# Patient Record
Sex: Female | Born: 1975 | Race: White | Hispanic: No | Marital: Married | Smoking: Never smoker
Health system: Southern US, Community
[De-identification: ages and names within clinical notes are randomized; demographics above are authoritative.]

## PROBLEM LIST (undated history)

## (undated) DIAGNOSIS — K219 Gastro-esophageal reflux disease without esophagitis: Secondary | ICD-10-CM

## (undated) DIAGNOSIS — IMO0001 Reserved for inherently not codable concepts without codable children: Secondary | ICD-10-CM

## (undated) HISTORY — PX: EYE SURGERY: SHX253

---

## 1998-10-11 ENCOUNTER — Inpatient Hospital Stay (HOSPITAL_COMMUNITY): Admission: AD | Admit: 1998-10-11 | Discharge: 1998-10-11 | Payer: Self-pay | Admitting: Obstetrics & Gynecology

## 1998-10-15 ENCOUNTER — Other Ambulatory Visit: Admission: RE | Admit: 1998-10-15 | Discharge: 1998-10-15 | Payer: Self-pay | Admitting: Obstetrics

## 1998-10-21 ENCOUNTER — Ambulatory Visit (HOSPITAL_COMMUNITY): Admission: RE | Admit: 1998-10-21 | Discharge: 1998-10-21 | Payer: Self-pay | Admitting: Obstetrics

## 1998-10-30 ENCOUNTER — Inpatient Hospital Stay (HOSPITAL_COMMUNITY): Admission: AD | Admit: 1998-10-30 | Discharge: 1998-10-30 | Payer: Self-pay | Admitting: Obstetrics

## 1999-01-22 ENCOUNTER — Inpatient Hospital Stay (HOSPITAL_COMMUNITY): Admission: AD | Admit: 1999-01-22 | Discharge: 1999-01-22 | Payer: Self-pay | Admitting: Obstetrics

## 1999-03-14 ENCOUNTER — Inpatient Hospital Stay (HOSPITAL_COMMUNITY): Admission: AD | Admit: 1999-03-14 | Discharge: 1999-03-14 | Payer: Self-pay | Admitting: Obstetrics

## 1999-03-18 ENCOUNTER — Other Ambulatory Visit: Admission: RE | Admit: 1999-03-18 | Discharge: 1999-03-18 | Payer: Self-pay | Admitting: Obstetrics

## 1999-04-13 ENCOUNTER — Inpatient Hospital Stay (HOSPITAL_COMMUNITY): Admission: AD | Admit: 1999-04-13 | Discharge: 1999-04-13 | Payer: Self-pay | Admitting: Obstetrics

## 1999-04-25 ENCOUNTER — Inpatient Hospital Stay (HOSPITAL_COMMUNITY): Admission: AD | Admit: 1999-04-25 | Discharge: 1999-04-25 | Payer: Self-pay | Admitting: *Deleted

## 1999-04-30 ENCOUNTER — Inpatient Hospital Stay (HOSPITAL_COMMUNITY): Admission: AD | Admit: 1999-04-30 | Discharge: 1999-04-30 | Payer: Self-pay | Admitting: Obstetrics

## 1999-05-08 ENCOUNTER — Inpatient Hospital Stay (HOSPITAL_COMMUNITY): Admission: AD | Admit: 1999-05-08 | Discharge: 1999-05-11 | Payer: Self-pay | Admitting: Obstetrics

## 2000-10-29 ENCOUNTER — Encounter: Admission: RE | Admit: 2000-10-29 | Discharge: 2000-10-29 | Payer: Self-pay | Admitting: Family Medicine

## 2000-10-29 ENCOUNTER — Encounter: Payer: Self-pay | Admitting: Family Medicine

## 2001-09-05 ENCOUNTER — Inpatient Hospital Stay (HOSPITAL_COMMUNITY): Admission: AD | Admit: 2001-09-05 | Discharge: 2001-09-05 | Payer: Self-pay | Admitting: Radiology

## 2001-09-05 ENCOUNTER — Encounter: Payer: Self-pay | Admitting: Obstetrics

## 2001-09-19 ENCOUNTER — Emergency Department (HOSPITAL_COMMUNITY): Admission: EM | Admit: 2001-09-19 | Discharge: 2001-09-19 | Payer: Self-pay | Admitting: Emergency Medicine

## 2001-10-17 ENCOUNTER — Other Ambulatory Visit: Admission: RE | Admit: 2001-10-17 | Discharge: 2001-10-17 | Payer: Self-pay

## 2001-11-25 ENCOUNTER — Ambulatory Visit (HOSPITAL_COMMUNITY): Admission: RE | Admit: 2001-11-25 | Discharge: 2001-11-25 | Payer: Self-pay | Admitting: Obstetrics and Gynecology

## 2001-11-25 ENCOUNTER — Encounter: Payer: Self-pay | Admitting: Obstetrics and Gynecology

## 2001-12-05 ENCOUNTER — Inpatient Hospital Stay (HOSPITAL_COMMUNITY): Admission: AD | Admit: 2001-12-05 | Discharge: 2001-12-05 | Payer: Self-pay | Admitting: Obstetrics and Gynecology

## 2002-02-11 ENCOUNTER — Inpatient Hospital Stay (HOSPITAL_COMMUNITY): Admission: AD | Admit: 2002-02-11 | Discharge: 2002-02-11 | Payer: Self-pay | Admitting: Obstetrics and Gynecology

## 2002-02-12 ENCOUNTER — Inpatient Hospital Stay (HOSPITAL_COMMUNITY): Admission: AD | Admit: 2002-02-12 | Discharge: 2002-02-12 | Payer: Self-pay | Admitting: Obstetrics and Gynecology

## 2002-02-14 ENCOUNTER — Inpatient Hospital Stay (HOSPITAL_COMMUNITY): Admission: AD | Admit: 2002-02-14 | Discharge: 2002-02-14 | Payer: Self-pay | Admitting: Obstetrics and Gynecology

## 2002-04-01 ENCOUNTER — Encounter: Payer: Self-pay | Admitting: Obstetrics and Gynecology

## 2002-04-01 ENCOUNTER — Inpatient Hospital Stay (HOSPITAL_COMMUNITY): Admission: AD | Admit: 2002-04-01 | Discharge: 2002-04-01 | Payer: Self-pay | Admitting: Obstetrics and Gynecology

## 2002-04-07 ENCOUNTER — Inpatient Hospital Stay (HOSPITAL_COMMUNITY): Admission: RE | Admit: 2002-04-07 | Discharge: 2002-04-07 | Payer: Self-pay | Admitting: Obstetrics and Gynecology

## 2002-04-11 ENCOUNTER — Encounter (HOSPITAL_COMMUNITY): Admission: RE | Admit: 2002-04-11 | Discharge: 2002-04-17 | Payer: Self-pay | Admitting: Obstetrics and Gynecology

## 2002-04-17 ENCOUNTER — Inpatient Hospital Stay (HOSPITAL_COMMUNITY): Admission: AD | Admit: 2002-04-17 | Discharge: 2002-04-17 | Payer: Self-pay | Admitting: Obstetrics and Gynecology

## 2002-04-17 ENCOUNTER — Encounter: Payer: Self-pay | Admitting: Obstetrics and Gynecology

## 2002-04-18 ENCOUNTER — Inpatient Hospital Stay (HOSPITAL_COMMUNITY): Admission: AD | Admit: 2002-04-18 | Discharge: 2002-04-20 | Payer: Self-pay | Admitting: Obstetrics and Gynecology

## 2004-05-29 ENCOUNTER — Other Ambulatory Visit: Admission: RE | Admit: 2004-05-29 | Discharge: 2004-05-29 | Payer: Self-pay | Admitting: Family Medicine

## 2006-01-20 ENCOUNTER — Encounter: Admission: RE | Admit: 2006-01-20 | Discharge: 2006-01-20 | Payer: Self-pay | Admitting: Family Medicine

## 2006-12-23 ENCOUNTER — Inpatient Hospital Stay (HOSPITAL_COMMUNITY): Admission: AD | Admit: 2006-12-23 | Discharge: 2006-12-23 | Payer: Self-pay | Admitting: Obstetrics & Gynecology

## 2011-01-04 ENCOUNTER — Emergency Department (HOSPITAL_COMMUNITY)
Admission: EM | Admit: 2011-01-04 | Discharge: 2011-01-04 | Disposition: A | Discharge status: Home / Self Care | Payer: Worker's Compensation | Attending: Emergency Medicine | Admitting: Emergency Medicine

## 2011-01-04 DIAGNOSIS — R21 Rash and other nonspecific skin eruption: Secondary | ICD-10-CM | POA: Insufficient documentation

## 2011-01-04 LAB — URINALYSIS, ROUTINE W REFLEX MICROSCOPIC
Hgb urine dipstick: NEGATIVE
Protein, ur: NEGATIVE mg/dL
Urobilinogen, UA: 0.2 mg/dL (ref 0.0–1.0)

## 2011-01-04 LAB — POCT I-STAT, CHEM 8
HCT: 47 % — ABNORMAL HIGH (ref 36.0–46.0)
Hemoglobin: 16 g/dL — ABNORMAL HIGH (ref 12.0–15.0)
Potassium: 3.6 mEq/L (ref 3.5–5.1)
Sodium: 140 mEq/L (ref 135–145)

## 2011-02-06 ENCOUNTER — Emergency Department (HOSPITAL_COMMUNITY)
Admission: EM | Admit: 2011-02-06 | Discharge: 2011-02-07 | Disposition: A | Discharge status: Home / Self Care | Payer: Self-pay | Attending: Emergency Medicine | Admitting: Emergency Medicine

## 2011-02-06 DIAGNOSIS — R209 Unspecified disturbances of skin sensation: Secondary | ICD-10-CM | POA: Insufficient documentation

## 2011-02-06 DIAGNOSIS — R030 Elevated blood-pressure reading, without diagnosis of hypertension: Secondary | ICD-10-CM | POA: Insufficient documentation

## 2011-02-06 LAB — POCT PREGNANCY, URINE: Preg Test, Ur: NEGATIVE

## 2011-02-07 LAB — COMPREHENSIVE METABOLIC PANEL
Alkaline Phosphatase: 77 U/L (ref 39–117)
BUN: 7 mg/dL (ref 6–23)
CO2: 28 mEq/L (ref 19–32)
Chloride: 101 mEq/L (ref 96–112)
Creatinine, Ser: 0.59 mg/dL (ref 0.4–1.2)
GFR calc non Af Amer: >60 mL/min (ref >60)
Glucose, Bld: 112 mg/dL — ABNORMAL HIGH (ref 70–99)
Potassium: 3.6 mEq/L (ref 3.5–5.1)
Total Bilirubin: 0.4 mg/dL (ref 0.3–1.2)

## 2011-02-07 LAB — TSH: TSH: 1.436 u[IU]/mL (ref 0.350–4.500)

## 2011-02-07 LAB — CBC
MCHC: 33.7 g/dL (ref 30.0–36.0)
MCV: 86.4 fL (ref 78.0–100.0)
Platelets: 360 10*3/uL (ref 150–400)
RDW: 12.8 % (ref 11.5–15.5)
WBC: 10 10*3/uL (ref 4.0–10.5)

## 2011-02-07 LAB — URINALYSIS, ROUTINE W REFLEX MICROSCOPIC
Bilirubin Urine: NEGATIVE
Ketones, ur: NEGATIVE mg/dL
Nitrite: NEGATIVE
Protein, ur: NEGATIVE mg/dL
Specific Gravity, Urine: 1.013 (ref 1.005–1.030)
Urobilinogen, UA: 1 mg/dL (ref 0.0–1.0)

## 2011-02-07 LAB — DIFFERENTIAL
Eosinophils Absolute: 0.5 10*3/uL (ref 0.0–0.7)
Eosinophils Relative: 5 % (ref 0–5)
Lymphs Abs: 3.3 10*3/uL (ref 0.7–4.0)
Monocytes Absolute: 0.6 10*3/uL (ref 0.1–1.0)

## 2011-02-07 LAB — URINE MICROSCOPIC-ADD ON

## 2011-02-07 LAB — T4, FREE: Free T4: 1.17 ng/dL (ref 0.80–1.80)

## 2011-08-27 ENCOUNTER — Encounter: Payer: Self-pay | Admitting: Family Medicine

## 2011-08-27 ENCOUNTER — Emergency Department (HOSPITAL_BASED_OUTPATIENT_CLINIC_OR_DEPARTMENT_OTHER)
Admission: EM | Admit: 2011-08-27 | Discharge: 2011-08-27 | Disposition: A | Discharge status: Home / Self Care | Payer: Self-pay | Attending: Emergency Medicine | Admitting: Emergency Medicine

## 2011-08-27 DIAGNOSIS — J40 Bronchitis, not specified as acute or chronic: Secondary | ICD-10-CM | POA: Insufficient documentation

## 2011-08-27 MED ORDER — HYDROCOD POLST-CHLORPHEN POLST 10-8 MG/5ML PO LQCR: 5.0000 mL | Freq: Every evening | ORAL | Status: DC | PRN

## 2011-08-27 NOTE — ED Provider Notes (Signed)
History     CSN: [id number] Arrival date & time: 08/27/2011  7:32 AM   First MD Initiated Contact with Patient 08/27/11 0732      Chief Complaint  Patient presents with  . Cough    (Consider location/radiation/quality/duration/timing/severity/associated sxs/prior treatment) HPI Patient with uri symptoms, cough, fever for one week.  Patient had fever to 102 on Sunday, low grade to 99 on Monday.  Taking antipyretics since but no further definite fever.  Coughing up green sputum.  Patient with some dyspnea, but no history of asthma.  Patient works at day care.  She has had uri symptoms for three weeks but fever and cough just began.  No flu shot this year.   History reviewed. No pertinent past medical history.  History reviewed. No pertinent past surgical history.  No family history on file.  History  Substance Use Topics  . Smoking status: Not on file  . Smokeless tobacco: Not on file  . Alcohol Use: Not on file    OB History    Grav Para Term Preterm Abortions TAB SAB Ect Mult Living                  Review of Systems  All other systems reviewed and are negative.    Allergies  Review of patient's allergies indicates not on file.  Home Medications  No current outpatient prescriptions on file.  BP 142/85  Pulse 89  Temp(Src) 97.4 F (36.3 C) (Oral)  Resp 16  Ht 5' (1.524 m)  Wt 182 lb (82.555 kg)  BMI 35.54 kg/m2  SpO2 98%  Physical Exam  Vitals reviewed. Constitutional: She is oriented to person, place, and time. She appears well-developed and well-nourished.  HENT:  Head: Normocephalic and atraumatic.  Right Ear: External ear normal.  Left Ear: External ear normal.  Nose: Nose normal.  Mouth/Throat: Oropharynx is clear and moist.  Eyes: Conjunctivae and EOM are normal. Pupils are equal, round, and reactive to light.  Neck: Normal range of motion. Neck supple.  Cardiovascular: Normal rate, regular rhythm and normal heart sounds.   Pulmonary/Chest:  Effort normal.  Abdominal: Soft. Bowel sounds are normal.  Musculoskeletal: Normal range of motion.  Neurological: She is alert and oriented to person, place, and time. She has normal reflexes.  Skin: Skin is warm and dry.  Psychiatric: She has a normal mood and affect. Her behavior is normal. Thought content normal.    ED Course  Procedures (including critical care time)  Labs Reviewed - No data to display No results found.   No diagnosis found.    MDM          Hilario Quarry, MD 08/27/11 [phone]

## 2011-08-27 NOTE — ED Notes (Signed)
Pt c/o cough x 3 wks. Pt reports "on and off fever".

## 2012-01-28 ENCOUNTER — Encounter (HOSPITAL_BASED_OUTPATIENT_CLINIC_OR_DEPARTMENT_OTHER): Payer: Self-pay | Admitting: *Deleted

## 2012-01-28 ENCOUNTER — Emergency Department (HOSPITAL_BASED_OUTPATIENT_CLINIC_OR_DEPARTMENT_OTHER)
Admission: EM | Admit: 2012-01-28 | Discharge: 2012-01-28 | Disposition: A | Discharge status: Home / Self Care | Payer: Self-pay | Attending: Emergency Medicine | Admitting: Emergency Medicine

## 2012-01-28 DIAGNOSIS — K219 Gastro-esophageal reflux disease without esophagitis: Secondary | ICD-10-CM | POA: Insufficient documentation

## 2012-01-28 DIAGNOSIS — R112 Nausea with vomiting, unspecified: Secondary | ICD-10-CM | POA: Insufficient documentation

## 2012-01-28 DIAGNOSIS — K2970 Gastritis, unspecified, without bleeding: Secondary | ICD-10-CM | POA: Insufficient documentation

## 2012-01-28 HISTORY — DX: Reserved for inherently not codable concepts without codable children: IMO0001

## 2012-01-28 HISTORY — DX: Gastro-esophageal reflux disease without esophagitis: K21.9

## 2012-01-28 LAB — URINALYSIS, ROUTINE W REFLEX MICROSCOPIC
Ketones, ur: 15 mg/dL — AB
Nitrite: NEGATIVE
Protein, ur: 30 mg/dL — AB

## 2012-01-28 LAB — URINE MICROSCOPIC-ADD ON

## 2012-01-28 MED ORDER — ONDANSETRON 8 MG PO TBDP
8.0000 mg | ORAL_TABLET | Freq: Once | ORAL | Status: AC
  Administered 2012-01-28: 8 mg via ORAL
  Filled 2012-01-28: qty 1

## 2012-01-28 MED ORDER — ONDANSETRON 4 MG PO TBDP: 4.0000 mg | ORAL_TABLET | Freq: Once | ORAL | Status: DC

## 2012-01-28 MED ORDER — LANSOPRAZOLE 30 MG PO CPDR: 30.0000 mg | DELAYED_RELEASE_CAPSULE | Freq: Every day | ORAL | Status: DC

## 2012-01-28 MED ORDER — GI COCKTAIL ~~LOC~~
30.0000 mL | Freq: Once | ORAL | Status: AC
  Administered 2012-01-28: 30 mL via ORAL
  Filled 2012-01-28: qty 30

## 2012-01-28 NOTE — ED Notes (Signed)
Patient states she has chronic reflux problems her whole life.  States for the last 3 weeks, she has had an increase in the reflux with regurgitation into her throat.  For the last several days she has had vomiting, nausea and bloating.

## 2012-01-28 NOTE — Discharge Instructions (Signed)
Gastritis Gastritis is an inflammation (the body's way of reacting to injury and/or infection) of the stomach. It is often caused by viral or bacterial (germ) infections. It can also be caused by chemicals (including alcohol) and medications. This illness may be associated with generalized malaise (feeling tired, not well), cramps, and fever. The illness may last 2 to 7 days. If symptoms of gastritis continue, gastroscopy (looking into the stomach with a telescope-like instrument), biopsy (taking tissue samples), and/or blood tests may be necessary to determine the cause. Antibiotics will not affect the illness unless there is a bacterial infection present. One common bacterial cause of gastritis is an organism known as H. Pylori. This can be treated with antibiotics. Other forms of gastritis are caused by too much acid in the stomach. They can be treated with medications such as H2 blockers and antacids. Home treatment is usually all that is needed. Young children will quickly become dehydrated (loss of body fluids) if vomiting and diarrhea are both present. Medications may be given to control nausea. Medications are usually not given for diarrhea unless especially bothersome. Some medications slow the removal of the virus from the gastrointestinal tract. This slows down the healing process. HOME CARE INSTRUCTIONS Home care instructions for nausea and vomiting:  For adults: drink small amounts of fluids often. Drink at least 2 quarts a day. Take sips frequently. Do not drink large amounts of fluid at one time. This may worsen the nausea.   Only take over-the-counter or prescription medicines for pain, discomfort, or fever as directed by your caregiver.   Drink clear liquids only. Those are anything you can see through such as water, broth, or soft drinks.   Once you are keeping clear liquids down, you may start full liquids, soups, juices, and ice cream or sherbet. Slowly add bland (plain, not spicy)  foods to your diet.  Home care instructions for diarrhea:  Diarrhea can be caused by bacterial infections or a virus. Your condition should improve with time, rest, fluids, and/or anti-diarrheal medication.   Until your diarrhea is under control, you should drink clear liquids often in small amounts. Clear liquids include: water, broth, jell-o water and weak tea.  Avoid:  Milk.   Fruits.   Tobacco.   Alcohol.   Extremely hot or cold fluids.   Too much intake of anything at one time.  When your diarrhea stops you may add the following foods, which help the stool to become more formed:  Rice.   Bananas.   Apples without skin.   Dry toast.  Once these foods are tolerated you may add low-fat yogurt and low-fat cottage cheese. They will help to restore the normal bacterial balance in your bowel. Wash your hands well to avoid spreading bacteria (germ) or virus. SEEK IMMEDIATE MEDICAL CARE IF:   You are unable to keep fluids down.   Vomiting or diarrhea become persistent (constant).   Abdominal pain develops, increases, or localizes. (Right sided pain can be appendicitis. Left sided pain in adults can be diverticulitis.)   You develop a fever (an oral temperature above 102 F (38.9 C)).   Diarrhea becomes excessive or contains blood or mucus.   You have excessive weakness, dizziness, fainting or extreme thirst.   You are not improving or you are getting worse.   You have any other questions or concerns.  Document Released: 08/18/2001 Document Revised: 08/13/2011 Document Reviewed: 08/24/2005 [employer] Patient Information 2012 [employer]. 

## 2012-01-28 NOTE — ED Provider Notes (Signed)
History     CSN: [id number]  Arrival date & time 01/28/12  8657   First MD Initiated Contact with Patient 01/28/12 0815      Chief Complaint  Patient presents with  . Nausea  . Emesis    (Consider location/radiation/quality/duration/timing/severity/associated sxs/prior treatment) HPI Comments: Patient states, that she's had a long-standing history of acid reflux symptoms. She, states it's usually fairly mild however, over the last 3, weeks, she's had more persistent burning into her soft guess, and an acid-like taste in the mouth. She's also had some occasional nausea and vomiting. The last time. She had emesis was last night. It's nonbilious nonbloody. Emesis. She feels that her abdomen is a little bloated at times. Denies any pain in her abdomen. She denies any fevers. She's having normal bowel movements. She states, that she's been under a lot of stress recently and feels that this is contributing to her symptoms. She's been taking some milk of magnesia with no relief  Patient is a 36 y.o. female presenting with vomiting. The history is provided by the patient.  Emesis  Pertinent negatives include no abdominal pain, no arthralgias, no chills, no cough, no diarrhea, no fever and no headaches.    Past Medical History  Diagnosis Date  . Reflux     Past Surgical History  Procedure Date  . Eye surgery     No family history on file.  History  Substance Use Topics  . Smoking status: Never Smoker   . Smokeless tobacco: Not on file  . Alcohol Use: Yes    OB History    Grav Para Term Preterm Abortions TAB SAB Ect Mult Living                  Review of Systems  Constitutional: Negative for fever, chills, diaphoresis and fatigue.  HENT: Negative for congestion, rhinorrhea and sneezing.   Eyes: Negative.   Respiratory: Negative for cough, chest tightness and shortness of breath.   Cardiovascular: Negative for chest pain and leg swelling.  Gastrointestinal: Positive for  nausea and vomiting. Negative for abdominal pain, diarrhea and blood in stool.  Genitourinary: Negative for frequency, hematuria, flank pain and difficulty urinating.  Musculoskeletal: Negative for back pain and arthralgias.  Skin: Negative for rash.  Neurological: Negative for dizziness, speech difficulty, weakness, numbness and headaches.  Psychiatric/Behavioral: The patient is nervous/anxious.     Allergies  Review of patient's allergies indicates no known allergies.  Home Medications   Current Outpatient Rx  Name Route Sig Dispense Refill  . ALUM & MAG HYDROXIDE-SIMETH 500-450-40 MG/5ML PO SUSP Oral Take by mouth every 6 (six) hours as needed.    Marland Kitchen HYDROCOD POLST-CPM POLST ER 10-8 MG/5ML PO LQCR Oral Take 5 mLs by mouth at bedtime as needed. 140 mL 0  . LANSOPRAZOLE 30 MG PO CPDR Oral Take 1 capsule (30 mg total) by mouth daily before breakfast. 30 minutes before meal 30 capsule 2    BP 127/83  Pulse 76  Temp 98.1 F (36.7 C)  Resp 20  Ht 5' (1.524 m)  Wt 180 lb (81.647 kg)  BMI 35.15 kg/m2  SpO2 100%  LMP 01/28/2012  Physical Exam  Constitutional: She is oriented to person, place, and time. She appears well-developed and well-nourished.  HENT:  Head: Normocephalic and atraumatic.  Eyes: Pupils are equal, round, and reactive to light.  Neck: Normal range of motion. Neck supple.  Cardiovascular: Normal rate, regular rhythm and normal heart sounds.   Pulmonary/Chest: Effort  normal and breath sounds normal. No respiratory distress. She has no wheezes. She has no rales. She exhibits no tenderness.  Abdominal: Soft. Bowel sounds are normal. There is no tenderness. There is no rebound and no guarding.  Musculoskeletal: Normal range of motion. She exhibits no edema.  Lymphadenopathy:    She has no cervical adenopathy.  Neurological: She is alert and oriented to person, place, and time.  Skin: Skin is warm and dry. No rash noted.  Psychiatric: She has a normal mood and  affect.    ED Course  Procedures (including critical care time)  Results for orders placed during the hospital encounter of 01/28/12  URINALYSIS, ROUTINE W REFLEX MICROSCOPIC      Component Value Range   Color, Urine RED (*) YELLOW    APPearance CLOUDY (*) CLEAR    Specific Gravity, Urine 1.024  1.005 - 1.030    pH 7.0  5.0 - 8.0    Glucose, UA NEGATIVE  NEGATIVE (mg/dL)   Hgb urine dipstick LARGE (*) NEGATIVE    Bilirubin Urine SMALL (*) NEGATIVE    Ketones, ur 15 (*) NEGATIVE (mg/dL)   Protein, ur 30 (*) NEGATIVE (mg/dL)   Urobilinogen, UA 1.0  0.0 - 1.0 (mg/dL)   Nitrite NEGATIVE  NEGATIVE    Leukocytes, UA SMALL (*) NEGATIVE   PREGNANCY, URINE      Component Value Range   Preg Test, Ur NEGATIVE  NEGATIVE   URINE MICROSCOPIC-ADD ON      Component Value Range   Squamous Epithelial / LPF RARE  RARE    WBC, UA 3-6  <3 (WBC/hpf)   RBC / HPF TOO NUMEROUS TO COUNT  <3 (RBC/hpf)   Bacteria, UA FEW (*) RARE    No results found.    1. Gastritis       MDM  Patient presents with a three-week history of worsening reflux type symptoms. She has no abdominal pain to suggest cholelithiasis, kidney stone, or other intra-abdominal pathology. I will go ahead and discharge her with prescription for a proton pump inhibitor and counseled her on diet changes. She does  say, that she's been careful with her diet over the last several days and has noted some improvement of symptoms. She has a followup appointment with primary care physician on Tuesday. Advisor. Is not getting better. By then. She may need further workup including abdominal ultrasound or referral to a gastroenterologist. The right or turn here as needed. For any worsening symptoms  Pt has grossly bloody urine, but is currently on her period.  Advised her to have her urine rechecked on Tuesday by her PCP      Rolan Bucco, MD 01/28/12 [phone]

## 2012-09-17 ENCOUNTER — Emergency Department (HOSPITAL_BASED_OUTPATIENT_CLINIC_OR_DEPARTMENT_OTHER)
Admission: EM | Admit: 2012-09-17 | Discharge: 2012-09-17 | Disposition: A | Discharge status: Home / Self Care | Payer: Self-pay | Attending: Emergency Medicine | Admitting: Emergency Medicine

## 2012-09-17 ENCOUNTER — Encounter (HOSPITAL_BASED_OUTPATIENT_CLINIC_OR_DEPARTMENT_OTHER): Payer: Self-pay | Admitting: *Deleted

## 2012-09-17 ENCOUNTER — Emergency Department (HOSPITAL_BASED_OUTPATIENT_CLINIC_OR_DEPARTMENT_OTHER): Payer: Self-pay

## 2012-09-17 DIAGNOSIS — R109 Unspecified abdominal pain: Secondary | ICD-10-CM | POA: Insufficient documentation

## 2012-09-17 DIAGNOSIS — K5900 Constipation, unspecified: Secondary | ICD-10-CM | POA: Insufficient documentation

## 2012-09-17 DIAGNOSIS — Z3202 Encounter for pregnancy test, result negative: Secondary | ICD-10-CM | POA: Insufficient documentation

## 2012-09-17 DIAGNOSIS — K219 Gastro-esophageal reflux disease without esophagitis: Secondary | ICD-10-CM | POA: Insufficient documentation

## 2012-09-17 DIAGNOSIS — Z79899 Other long term (current) drug therapy: Secondary | ICD-10-CM | POA: Insufficient documentation

## 2012-09-17 LAB — URINALYSIS, ROUTINE W REFLEX MICROSCOPIC
Bilirubin Urine: NEGATIVE
Glucose, UA: NEGATIVE mg/dL
Ketones, ur: NEGATIVE mg/dL
Leukocytes, UA: NEGATIVE
Nitrite: NEGATIVE
Protein, ur: NEGATIVE mg/dL
Specific Gravity, Urine: 1.014 (ref 1.005–1.030)
Urobilinogen, UA: 1 mg/dL (ref 0.0–1.0)
pH: 7 (ref 5.0–8.0)

## 2012-09-17 LAB — URINE MICROSCOPIC-ADD ON

## 2012-09-17 LAB — PREGNANCY, URINE: Preg Test, Ur: NEGATIVE

## 2012-09-17 MED ORDER — POLYETHYLENE GLYCOL 3350 17 G PO PACK: 17.0000 g | PACK | Freq: Every day | ORAL | Status: DC

## 2012-09-17 NOTE — ED Provider Notes (Signed)
Medical screening examination/treatment/procedure(s) were performed by non-physician practitioner and as supervising physician I was immediately available for consultation/collaboration.   Yariana Hoaglund Y. Cecylia Brazill, MD 09/17/12 2256 

## 2012-09-17 NOTE — ED Provider Notes (Signed)
History     CSN: [id number]  Arrival date & time 09/17/12  1906   First MD Initiated Contact with Patient 09/17/12 2142      Chief Complaint  Patient presents with  . Back Pain    (Consider location/radiation/quality/duration/timing/severity/associated sxs/prior treatment) Patient is a 37 y.o. female presenting with back pain. The history is provided by the patient. No language interpreter was used.  Back Pain  This is a new problem. The problem occurs constantly. The problem has been gradually worsening. The pain is associated with no known injury. The pain is present in the lumbar spine. The pain is at a severity of 7/10. The pain is moderate. The pain is the same all the time. Stiffness is present all day. Associated symptoms include abdominal pain. She has tried nothing for the symptoms. The treatment provided moderate relief.   Pt reports she has been constipated on and off.  Pt has pain in left back that she thinks may be from constipation Past Medical History  Diagnosis Date  . Reflux     Past Surgical History  Procedure Date  . Eye surgery     No family history on file.  History  Substance Use Topics  . Smoking status: Never Smoker   . Smokeless tobacco: Not on file  . Alcohol Use: Yes     Comment: social     OB History    Grav Para Term Preterm Abortions TAB SAB Ect Mult Living                  Review of Systems  Gastrointestinal: Positive for abdominal pain.  Musculoskeletal: Positive for back pain.  All other systems reviewed and are negative.    Allergies  Review of patient's allergies indicates no known allergies.  Home Medications   Current Outpatient Rx  Name  Route  Sig  Dispense  Refill  . RANITIDINE HCL 300 MG PO TABS   Oral   Take 300 mg by mouth 2 (two) times daily. Has not taken in 3 weeks         . ALUM & MAG HYDROXIDE-SIMETH 500-450-40 MG/5ML PO SUSP   Oral   Take by mouth every 6 (six) hours as needed.         Marland Kitchen HYDROCOD  POLST-CPM POLST ER 10-8 MG/5ML PO LQCR   Oral   Take 5 mLs by mouth at bedtime as needed.   140 mL   0   . LANSOPRAZOLE 30 MG PO CPDR   Oral   Take 1 capsule (30 mg total) by mouth daily before breakfast. 30 minutes before meal   30 capsule   2     BP 144/85  Pulse 104  Temp 98.8 F (37.1 C) (Oral)  Resp 18  Ht 5' (1.524 m)  Wt 182 lb 6 oz (82.725 kg)  BMI 35.62 kg/m2  SpO2 96%  LMP 09/12/2012  Physical Exam  Nursing note and vitals reviewed. Constitutional: She appears well-developed and well-nourished.  HENT:  Head: Normocephalic and atraumatic.  Eyes: Conjunctivae normal are normal. Pupils are equal, round, and reactive to light.  Neck: Normal range of motion.  Cardiovascular: Normal rate and regular rhythm.   Pulmonary/Chest: Effort normal.  Abdominal: Soft.  Musculoskeletal: She exhibits no tenderness.  Neurological: She is alert.  Skin: Skin is warm.    ED Course  Procedures (including critical care time)  Labs Reviewed  URINALYSIS, ROUTINE W REFLEX MICROSCOPIC - Abnormal; Notable for the following:  APPearance CLOUDY (*)     Hgb urine dipstick MODERATE (*)     All other components within normal limits  URINE MICROSCOPIC-ADD ON - Abnormal; Notable for the following:    Bacteria, UA FEW (*)     All other components within normal limits  PREGNANCY, URINE   No results found.   No diagnosis found.    MDM  Pt advised that xray shows constipation.   Pt given rx for miralax.   I advised increase fiber       Lonia Skinner [employer], [place] 09/17/12 2256

## 2012-09-17 NOTE — ED Notes (Signed)
C/o right lower back pain that radiates into her groin area. Describes as a nagging type pain that is worse with movement. Denies any urinary symptoms. Denies any injury. Denies any kidney stone hx. Denies any fevers. C/o nausea and emesis times one this morning.

## 2013-02-03 ENCOUNTER — Emergency Department (HOSPITAL_BASED_OUTPATIENT_CLINIC_OR_DEPARTMENT_OTHER)
Admission: EM | Admit: 2013-02-03 | Discharge: 2013-02-03 | Disposition: A | Discharge status: Home / Self Care | Payer: Self-pay | Attending: Emergency Medicine | Admitting: Emergency Medicine

## 2013-02-03 ENCOUNTER — Encounter (HOSPITAL_BASED_OUTPATIENT_CLINIC_OR_DEPARTMENT_OTHER): Payer: Self-pay | Admitting: Emergency Medicine

## 2013-02-03 ENCOUNTER — Other Ambulatory Visit (HOSPITAL_BASED_OUTPATIENT_CLINIC_OR_DEPARTMENT_OTHER): Payer: Self-pay | Admitting: Emergency Medicine

## 2013-02-03 ENCOUNTER — Ambulatory Visit (HOSPITAL_BASED_OUTPATIENT_CLINIC_OR_DEPARTMENT_OTHER)
Admission: RE | Admit: 2013-02-03 | Discharge: 2013-02-03 | Disposition: A | Discharge status: Home / Self Care | Payer: Self-pay | Source: Ambulatory Visit | Attending: Emergency Medicine | Admitting: Emergency Medicine

## 2013-02-03 DIAGNOSIS — R1909 Other intra-abdominal and pelvic swelling, mass and lump: Secondary | ICD-10-CM | POA: Insufficient documentation

## 2013-02-03 DIAGNOSIS — R102 Pelvic and perineal pain: Secondary | ICD-10-CM

## 2013-02-03 DIAGNOSIS — Z79899 Other long term (current) drug therapy: Secondary | ICD-10-CM | POA: Insufficient documentation

## 2013-02-03 DIAGNOSIS — Z3202 Encounter for pregnancy test, result negative: Secondary | ICD-10-CM | POA: Insufficient documentation

## 2013-02-03 DIAGNOSIS — R1032 Left lower quadrant pain: Secondary | ICD-10-CM | POA: Insufficient documentation

## 2013-02-03 DIAGNOSIS — N949 Unspecified condition associated with female genital organs and menstrual cycle: Secondary | ICD-10-CM | POA: Insufficient documentation

## 2013-02-03 DIAGNOSIS — K219 Gastro-esophageal reflux disease without esophagitis: Secondary | ICD-10-CM | POA: Insufficient documentation

## 2013-02-03 LAB — URINALYSIS, ROUTINE W REFLEX MICROSCOPIC
Bilirubin Urine: NEGATIVE
Protein, ur: NEGATIVE mg/dL
Urobilinogen, UA: 1 mg/dL (ref 0.0–1.0)

## 2013-02-03 LAB — PREGNANCY, URINE: Preg Test, Ur: NEGATIVE

## 2013-02-03 LAB — URINE MICROSCOPIC-ADD ON

## 2013-02-03 NOTE — ED Provider Notes (Signed)
History     CSN: [id number]  Arrival date & time 02/03/13  0050   First MD Initiated Contact with Patient 02/03/13 [phone]      Chief Complaint  Patient presents with  . Pelvic Pain    (Consider location/radiation/quality/duration/timing/severity/associated sxs/prior treatment) HPI This is a 37 year old female who states her most recent menstrual period was late about 10 days. This delay was associated with nausea and vomiting. She began bleeding yesterday which was heavier than usual. Although has subsequently slowed. She is having left suprapubic pain with a sensation of swelling in her left lower quadrant. The sensation of swelling is similar to her usual dysmenorrhea which is usually central. Her suprapubic pain is mild, worse with palpation or movement. She is not nauseated at the present time.   Past Medical History  Diagnosis Date  . Reflux     Past Surgical History  Procedure Laterality Date  . Eye surgery      History reviewed. No pertinent family history.  History  Substance Use Topics  . Smoking status: Never Smoker   . Smokeless tobacco: Not on file  . Alcohol Use: Yes     Comment: social     OB History   Grav Para Term Preterm Abortions TAB SAB Ect Mult Living                  Review of Systems  All other systems reviewed and are negative.    Allergies  Review of patient's allergies indicates no known allergies.  Home Medications   Current Outpatient Rx  Name  Route  Sig  Dispense  Refill  . aluminum & magnesium hydroxide-simethicone (MYLANTA) 500-450-40 MG/5ML suspension   Oral   Take by mouth every 6 (six) hours as needed.         . chlorpheniramine-HYDROcodone (TUSSIONEX PENNKINETIC ER) 10-8 MG/5ML LQCR   Oral   Take 5 mLs by mouth at bedtime as needed.   140 mL   0   . EXPIRED: lansoprazole (PREVACID) 30 MG capsule   Oral   Take 1 capsule (30 mg total) by mouth daily before breakfast. 30 minutes before meal   30 capsule   2   .  polyethylene glycol (MIRALAX) packet   Oral   Take 17 g by mouth daily.   14 each   0   . ranitidine (ZANTAC) 300 MG tablet   Oral   Take 300 mg by mouth 2 (two) times daily. Has not taken in 3 weeks           BP 133/91  Pulse 107  Temp(Src) 98.4 F (36.9 C) (Oral)  Resp 16  Ht 5' (1.524 m)  Wt 175 lb (79.379 kg)  BMI 34.18 kg/m2  SpO2 100%  LMP 02/02/2013  Physical Exam General: Well-developed, well-nourished female in no acute distress; appearance consistent with age of record HENT: normocephalic, atraumatic Eyes: pupils equal round and reactive to light; extraocular muscles intact Neck: supple Heart: regular rate and rhythm Lungs: clear to auscultation bilaterally Abdomen: soft; nondistended; mild left suprapubic; bowel sounds present GU: Mild left CVA tenderness; normal external genitalia; vaginal bleeding with dark blood and cervical os; cervical os closed; no cervical motion tenderness; mild left adnexal tenderness Extremities: No deformity; full range of motion; pulses normal Neurologic: Awake, alert and oriented; motor function intact in all extremities and symmetric; no facial droop Skin: Warm and dry Psychiatric: Normal mood and affect    ED Course  Procedures (including critical care time)  MDM   Nursing notes and vitals signs, including pulse oximetry, reviewed.  Summary of this visit's results, reviewed by myself:  Labs:  Results for orders placed during the hospital encounter of 02/03/13 (from the past 24 hour(s))  URINALYSIS, ROUTINE W REFLEX MICROSCOPIC     Status: Abnormal   Collection Time    02/03/13 12:56 AM      Result Value Range   Color, Urine AMBER (*) YELLOW   APPearance CLOUDY (*) CLEAR   Specific Gravity, Urine 1.012  1.005 - 1.030   pH 7.5  5.0 - 8.0   Glucose, UA NEGATIVE  NEGATIVE mg/dL   Hgb urine dipstick LARGE (*) NEGATIVE   Bilirubin Urine NEGATIVE  NEGATIVE   Ketones, ur NEGATIVE  NEGATIVE mg/dL   Protein, ur  NEGATIVE  NEGATIVE mg/dL   Urobilinogen, UA 1.0  0.0 - 1.0 mg/dL   Nitrite NEGATIVE  NEGATIVE   Leukocytes, UA SMALL (*) NEGATIVE  PREGNANCY, URINE     Status: None   Collection Time    02/03/13 12:56 AM      Result Value Range   Preg Test, Ur NEGATIVE  NEGATIVE  URINE MICROSCOPIC-ADD ON     Status: Abnormal   Collection Time    02/03/13 12:56 AM      Result Value Range   Squamous Epithelial / LPF FEW (*) RARE   WBC, UA 3-6  <3 WBC/hpf   RBC / HPF TOO NUMEROUS TO COUNT  <3 RBC/hpf   Bacteria, UA FEW (*) RARE    Urinalysis is not consistent with urinary tract infection but we will send it for culture. Will have her return later today for pelvic ultrasound to evaluate for cyst or other pathology.        Hanley Seamen, MD 02/03/13 [phone]

## 2013-02-03 NOTE — ED Notes (Signed)
Pt reports pelvic/abdominal swelling and flank pain x 10 days, period is 10 days late, started bleeding yesterday, pelvic and abdominal swelling have increased since period started

## 2013-02-04 LAB — URINE CULTURE: Colony Count: 80000

## 2013-07-13 ENCOUNTER — Other Ambulatory Visit: Payer: Self-pay

## 2013-11-11 ENCOUNTER — Emergency Department (HOSPITAL_BASED_OUTPATIENT_CLINIC_OR_DEPARTMENT_OTHER)
Admission: EM | Admit: 2013-11-11 | Discharge: 2013-11-11 | Disposition: A | Discharge status: Home / Self Care | Payer: No Typology Code available for payment source | Attending: Emergency Medicine | Admitting: Emergency Medicine

## 2013-11-11 ENCOUNTER — Encounter (HOSPITAL_BASED_OUTPATIENT_CLINIC_OR_DEPARTMENT_OTHER): Payer: Self-pay | Admitting: Emergency Medicine

## 2013-11-11 ENCOUNTER — Emergency Department (HOSPITAL_BASED_OUTPATIENT_CLINIC_OR_DEPARTMENT_OTHER): Payer: No Typology Code available for payment source

## 2013-11-11 DIAGNOSIS — R110 Nausea: Secondary | ICD-10-CM | POA: Insufficient documentation

## 2013-11-11 DIAGNOSIS — K219 Gastro-esophageal reflux disease without esophagitis: Secondary | ICD-10-CM | POA: Insufficient documentation

## 2013-11-11 DIAGNOSIS — R109 Unspecified abdominal pain: Secondary | ICD-10-CM | POA: Insufficient documentation

## 2013-11-11 DIAGNOSIS — Z3202 Encounter for pregnancy test, result negative: Secondary | ICD-10-CM | POA: Insufficient documentation

## 2013-11-11 DIAGNOSIS — Z79899 Other long term (current) drug therapy: Secondary | ICD-10-CM | POA: Insufficient documentation

## 2013-11-11 DIAGNOSIS — K5900 Constipation, unspecified: Secondary | ICD-10-CM | POA: Insufficient documentation

## 2013-11-11 LAB — BASIC METABOLIC PANEL
BUN: 11 mg/dL (ref 6–23)
CHLORIDE: 101 meq/L (ref 96–112)
CO2: 26 meq/L (ref 19–32)
CREATININE: 0.7 mg/dL (ref 0.50–1.10)
Calcium: 9.2 mg/dL (ref 8.4–10.5)
GFR calc Af Amer: >90 mL/min (ref >90)
GFR calc non Af Amer: >90 mL/min (ref >90)
GLUCOSE: 118 mg/dL — AB (ref 70–99)
Potassium: 4.1 mEq/L (ref 3.7–5.3)
SODIUM: 140 meq/L (ref 137–147)

## 2013-11-11 LAB — URINALYSIS, ROUTINE W REFLEX MICROSCOPIC
Bilirubin Urine: NEGATIVE
GLUCOSE, UA: NEGATIVE mg/dL
Hgb urine dipstick: NEGATIVE
KETONES UR: NEGATIVE mg/dL
LEUKOCYTES UA: NEGATIVE
NITRITE: NEGATIVE
PROTEIN: NEGATIVE mg/dL
Specific Gravity, Urine: 1.022 (ref 1.005–1.030)
UROBILINOGEN UA: 0.2 mg/dL (ref 0.0–1.0)
pH: 6 (ref 5.0–8.0)

## 2013-11-11 LAB — CBC WITH DIFFERENTIAL/PLATELET
BASOS ABS: 0 10*3/uL (ref 0.0–0.1)
Basophils Relative: 0 % (ref 0–1)
Eosinophils Absolute: 0.4 10*3/uL (ref 0.0–0.7)
Eosinophils Relative: 5 % (ref 0–5)
HEMATOCRIT: 43.7 % (ref 36.0–46.0)
Hemoglobin: 14.7 g/dL (ref 12.0–15.0)
LYMPHS ABS: 2.5 10*3/uL (ref 0.7–4.0)
LYMPHS PCT: 32 % (ref 12–46)
MCH: 29.1 pg (ref 26.0–34.0)
MCHC: 33.6 g/dL (ref 30.0–36.0)
MCV: 86.4 fL (ref 78.0–100.0)
MONO ABS: 0.6 10*3/uL (ref 0.1–1.0)
Monocytes Relative: 8 % (ref 3–12)
NEUTROS ABS: 4.3 10*3/uL (ref 1.7–7.7)
Neutrophils Relative %: 56 % (ref 43–77)
Platelets: 389 10*3/uL (ref 150–400)
RBC: 5.06 MIL/uL (ref 3.87–5.11)
RDW: 13.1 % (ref 11.5–15.5)
WBC: 7.8 10*3/uL (ref 4.0–10.5)

## 2013-11-11 LAB — PREGNANCY, URINE: PREG TEST UR: NEGATIVE

## 2013-11-11 MED ORDER — IBUPROFEN 600 MG PO TABS: 600.0000 mg | ORAL_TABLET | Freq: Four times a day (QID) | ORAL | Status: DC | PRN

## 2013-11-11 MED ORDER — CYCLOBENZAPRINE HCL 10 MG PO TABS: 10.0000 mg | ORAL_TABLET | Freq: Two times a day (BID) | ORAL | Status: DC | PRN

## 2013-11-11 MED ORDER — KETOROLAC TROMETHAMINE 30 MG/ML IJ SOLN
30.0000 mg | Freq: Once | INTRAMUSCULAR | Status: AC
  Administered 2013-11-11: 30 mg via INTRAVENOUS
  Filled 2013-11-11: qty 2

## 2013-11-11 MED ORDER — ONDANSETRON HCL 4 MG/2ML IJ SOLN
4.0000 mg | Freq: Once | INTRAMUSCULAR | Status: AC
  Administered 2013-11-11: 4 mg via INTRAVENOUS
  Filled 2013-11-11: qty 2

## 2013-11-11 MED ORDER — KETOROLAC TROMETHAMINE 15 MG/ML IJ SOLN
INTRAMUSCULAR | Status: AC
  Filled 2013-11-11: qty 1

## 2013-11-11 NOTE — ED Notes (Signed)
Patient c/o  L flank pain over the past two weeks that has grown worse this morning, nausea, no vomiting, decreased urination

## 2013-11-11 NOTE — ED Provider Notes (Signed)
CSN: [id number]     Arrival date & time 11/11/13  7829 History   First MD Initiated Contact with Patient 11/11/13 [phone]     Chief Complaint  Patient presents with  . Flank Pain     (Consider location/radiation/quality/duration/timing/severity/associated sxs/prior Treatment) HPI Comments: 2 weeks intermittent L sided flank pain w/ radiation to L abdomen. Pain is both sharp & dull. She has had assoc nause, few rare episodes of emesis about 1 week ago.  She has constipation at baseline which is unchanged.   Patient is a 38 y.o. female presenting with flank pain. The history is provided by the patient. No language interpreter was used.  Flank Pain This is a new problem. The current episode started more than 1 week ago. The problem occurs daily. The problem has been gradually worsening. Pertinent negatives include no chest pain, no abdominal pain, no headaches and no shortness of breath. The symptoms are aggravated by standing. The symptoms are relieved by lying down. She has tried nothing for the symptoms. The treatment provided no relief.    Past Medical History  Diagnosis Date  . Reflux    Past Surgical History  Procedure Laterality Date  . Eye surgery     History reviewed. No pertinent family history. History  Substance Use Topics  . Smoking status: Never Smoker   . Smokeless tobacco: Not on file  . Alcohol Use: Yes     Comment: social    OB History   Grav Para Term Preterm Abortions TAB SAB Ect Mult Living                 Review of Systems  Constitutional: Negative for fever, chills, diaphoresis, activity change, appetite change and fatigue.  HENT: Negative for congestion, facial swelling, rhinorrhea and sore throat.   Eyes: Negative for photophobia and discharge.  Respiratory: Negative for cough, chest tightness and shortness of breath.   Cardiovascular: Negative for chest pain, palpitations and leg swelling.  Gastrointestinal: Positive for nausea and constipation. Negative  for vomiting, abdominal pain and diarrhea.  Endocrine: Negative for polydipsia and polyuria.  Genitourinary: Positive for flank pain. Negative for dysuria, frequency, difficulty urinating and pelvic pain.  Musculoskeletal: Negative for arthralgias, back pain, neck pain and neck stiffness.  Skin: Negative for color change and wound.  Allergic/Immunologic: Negative for immunocompromised state.  Neurological: Negative for facial asymmetry, weakness, numbness and headaches.  Hematological: Does not bruise/bleed easily.  Psychiatric/Behavioral: Negative for confusion and agitation.      Allergies  Review of patient's allergies indicates no known allergies.  Home Medications   Current Outpatient Rx  Name  Route  Sig  Dispense  Refill  . aluminum & magnesium hydroxide-simethicone (MYLANTA) 500-450-40 MG/5ML suspension   Oral   Take by mouth every 6 (six) hours as needed.         . chlorpheniramine-HYDROcodone (TUSSIONEX PENNKINETIC ER) 10-8 MG/5ML LQCR   Oral   Take 5 mLs by mouth at bedtime as needed.   140 mL   0   . cyclobenzaprine (FLEXERIL) 10 MG tablet   Oral   Take 1 tablet (10 mg total) by mouth 2 (two) times daily as needed for muscle spasms.   10 tablet   0   . ibuprofen (ADVIL,MOTRIN) 600 MG tablet   Oral   Take 1 tablet (600 mg total) by mouth every 6 (six) hours as needed.   30 tablet   0   . EXPIRED: lansoprazole (PREVACID) 30 MG capsule   Oral  Take 1 capsule (30 mg total) by mouth daily before breakfast. 30 minutes before meal   30 capsule   2   . polyethylene glycol (MIRALAX) packet   Oral   Take 17 g by mouth daily.   14 each   0   . ranitidine (ZANTAC) 300 MG tablet   Oral   Take 300 mg by mouth 2 (two) times daily. Has not taken in 3 weeks          BP 133/84  Pulse 100  Temp(Src) 97.6 F (36.4 C) (Oral)  Resp 20  SpO2 100% Physical Exam  Constitutional: She is oriented to person, place, and time. She appears well-developed and  well-nourished. No distress.  HENT:  Head: Normocephalic and atraumatic.  Mouth/Throat: No oropharyngeal exudate.  Eyes: Pupils are equal, round, and reactive to light.  Neck: Normal range of motion. Neck supple.  Cardiovascular: Normal rate, regular rhythm and normal heart sounds.  Exam reveals no gallop and no friction rub.   No murmur heard. Pulmonary/Chest: Effort normal and breath sounds normal. No respiratory distress. She has no wheezes. She has no rales.  Abdominal: Soft. Bowel sounds are normal. She exhibits no distension and no mass. There is no tenderness. There is CVA tenderness (left). There is no rigidity, no rebound, no guarding, no tenderness at McBurney's point and negative Murphy's sign.    Musculoskeletal: Normal range of motion. She exhibits no edema and no tenderness.  Neurological: She is alert and oriented to person, place, and time.  Skin: Skin is warm and dry.  Psychiatric: She has a normal mood and affect.    ED Course  Procedures (including critical care time) Labs Review Labs Reviewed  BASIC METABOLIC PANEL - Abnormal; Notable for the following:    Glucose, Bld 118 (*)    All other components within normal limits  URINE CULTURE  URINALYSIS, ROUTINE W REFLEX MICROSCOPIC  PREGNANCY, URINE  CBC WITH DIFFERENTIAL   Imaging Review Ct Abdomen Pelvis Wo Contrast  11/11/2013   CLINICAL DATA:  Left flank pain, nausea.  EXAM: CT ABDOMEN AND PELVIS WITHOUT CONTRAST  TECHNIQUE: Multidetector CT imaging of the abdomen and pelvis was performed following the standard protocol without intravenous contrast.  COMPARISON:  None.  FINDINGS: Linear subsegmental atelectasis in the lung bases bilaterally. No pleural effusions. Heart is normal size.  2 cm gallstone within the fundus of the gallbladder. Liver, spleen, pancreas, adrenals and kidneys have an unremarkable unenhanced appearance. No renal or ureteral stone. No hydronephrosis.  Uterus, adnexae and urinary bladder are  unremarkable. Appendix is visualized and is normal. Bowel grossly unremarkable. No free fluid, free air, or adenopathy. Stomach grossly unremarkable. Aorta is normal caliber.  No acute bony abnormality.  IMPRESSION: Cholelithiasis.  No renal or ureteral stones.  No hydronephrosis.   Electronically Signed   By: Charlett NoseKevin  Dover M.D.   On: 11/11/2013 09:55     EKG Interpretation None      MDM   Final diagnoses:  Left flank pain    Pt is a 38 y.o. female with Pmhx as above who presents with 2 weeks of intermittent, sharp/dull L sided flank pain now w/ radiation to L abdomen with assoc fatigue, nausea.  No fever, chills, d/a. +constipation. No vag bleeding or d/c.  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  On PE, VSS, pt in NAD. +ttp L flank.  Abdominal exam benign.  UA nml, POC preg neg, CBC,  BMP unremarkable.  CT stone study negative for L sided findings. She does have 2cm gallstone which is not c/w pain at this presentation.  Pain well controlled after zofran & toradol. Suspect MSK cause of pain. Will d/c home w/ Rx for NSAIDs, muscle relaxer's.  Return precautions given for new or worsening symptoms including worsening pain, fever, numbness, weakness, inability to tolerate PO.          Shanna Cisco, MD 11/11/13 1011

## 2013-11-11 NOTE — Discharge Instructions (Signed)

## 2013-11-13 LAB — URINE CULTURE

## 2014-06-21 IMAGING — CT CT ABD-PELV W/O CM
2 of 4 series · 16 of 46 positions shown, 18 images · non-contrast
Comparison: None.

CLINICAL DATA: Left flank pain, nausea.

EXAM:
CT ABDOMEN AND PELVIS WITHOUT CONTRAST
TECHNIQUE: Multidetector CT imaging of the abdomen and pelvis was performed
following the standard protocol without intravenous contrast.

[Series 2: renal stone < 200 lbs 5.0 b31f · axial · 0.73mm/px · z∈[+442,+842]mm · 13 of 88 slices shown, 15 images]
[im 4/88  soft-tissue]
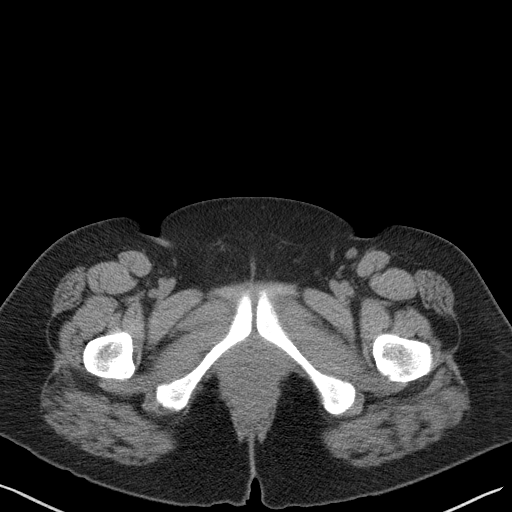
[im 4/88  bone]
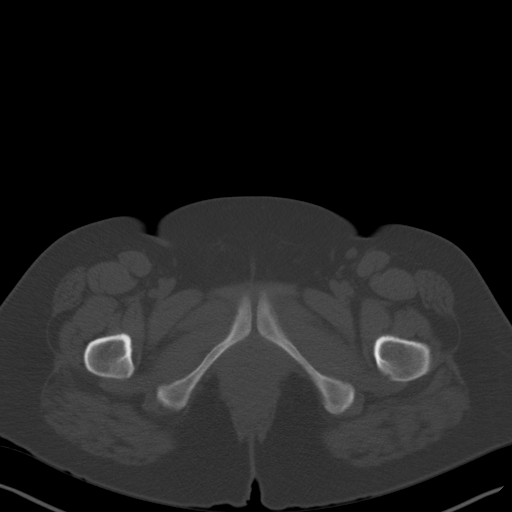
[im 12/88  soft-tissue]
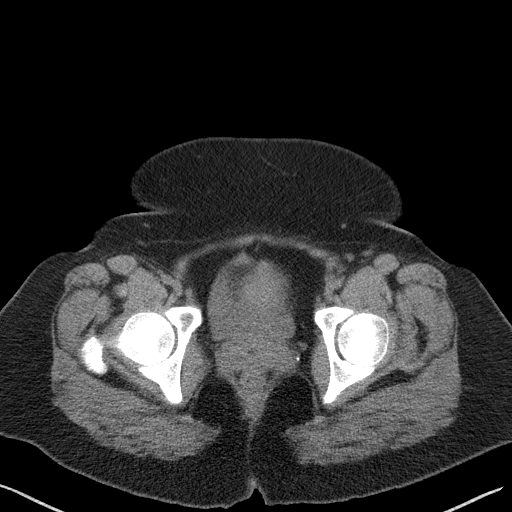
[im 19/88  soft-tissue]
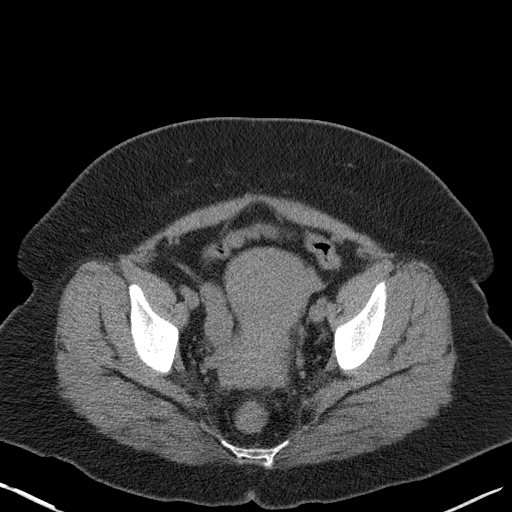
[im 23/88  soft-tissue]
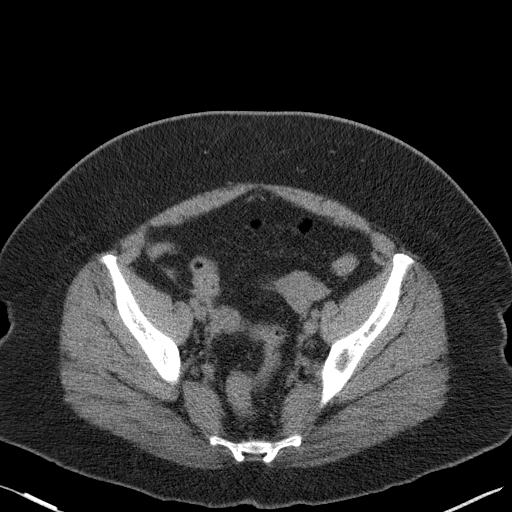
[im 31/88  soft-tissue]
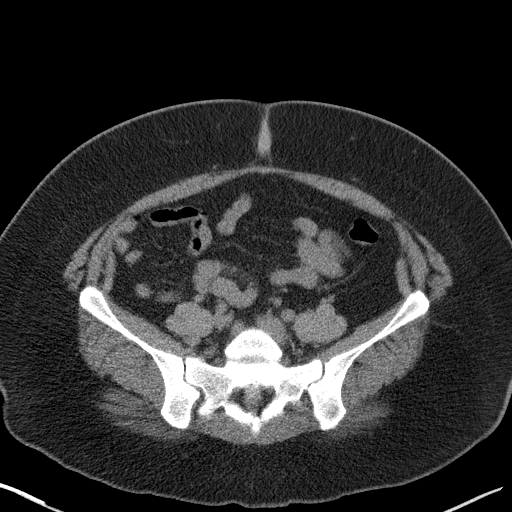
[im 38/88  soft-tissue]
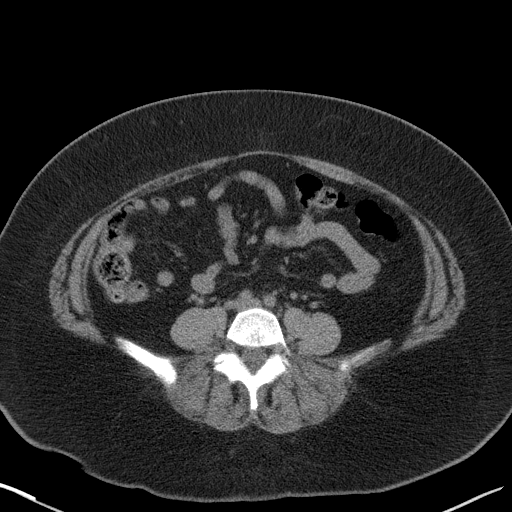
[im 46/88  soft-tissue]
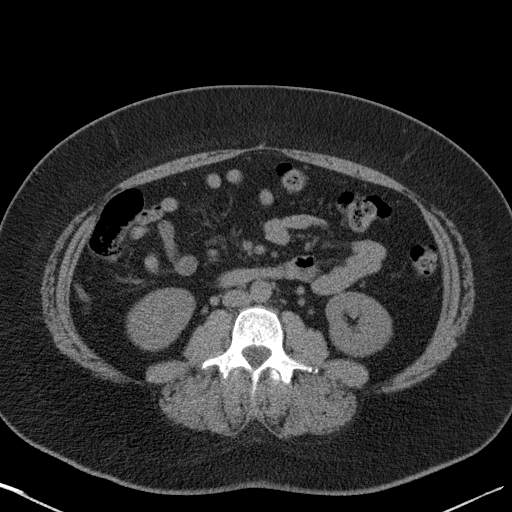
[im 50/88  soft-tissue]
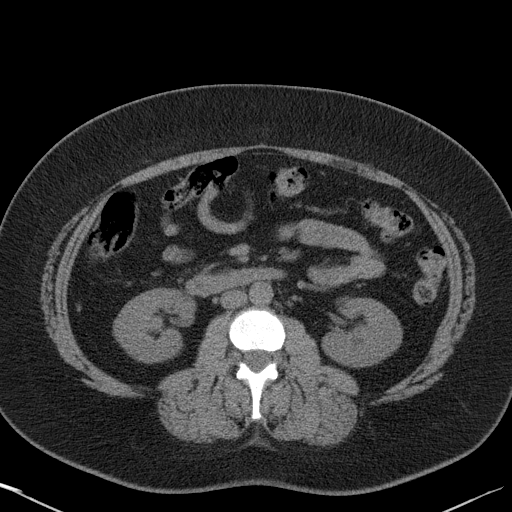
[im 57/88  soft-tissue]
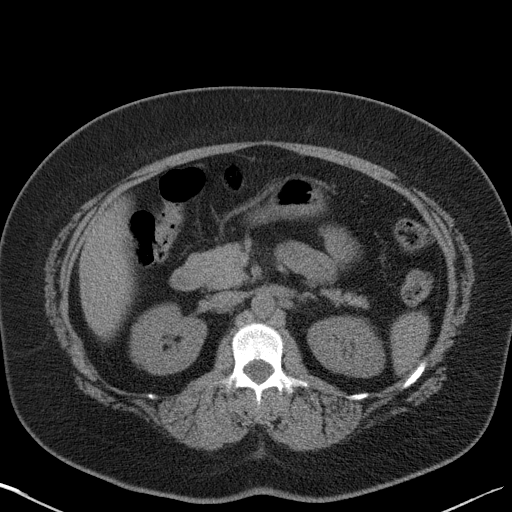
[im 57/88  bone]
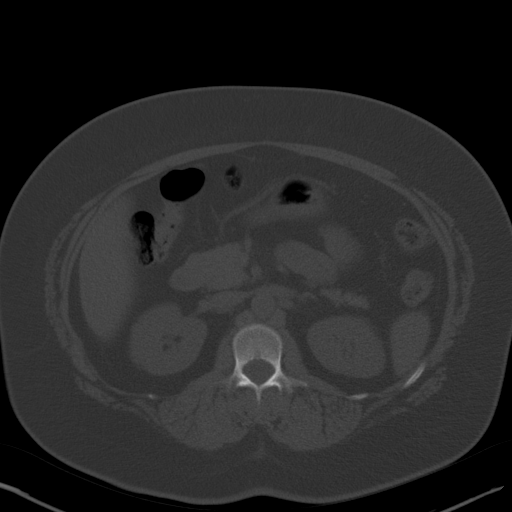
[im 65/88  soft-tissue]
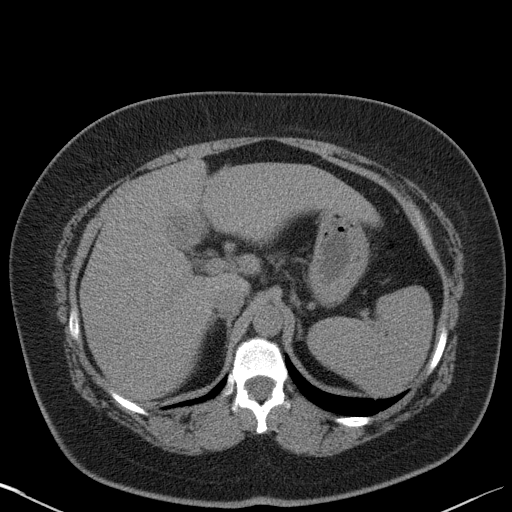
[im 69/88  soft-tissue]
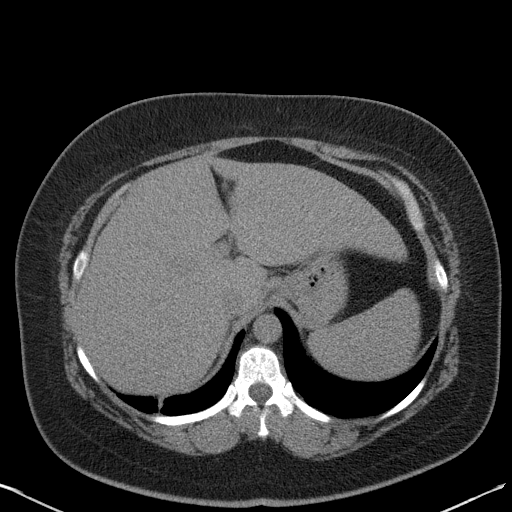
[im 76/88  soft-tissue]
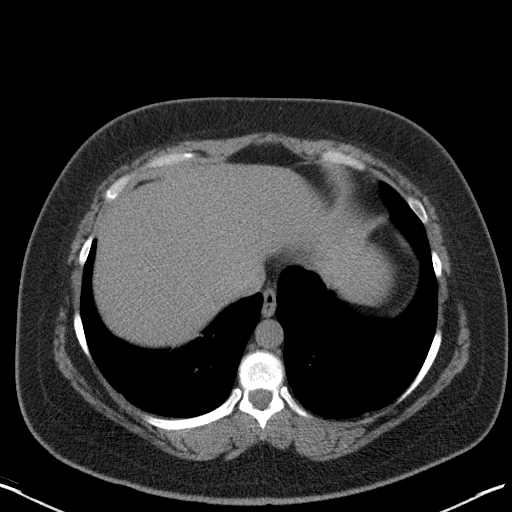
[im 84/88  soft-tissue]
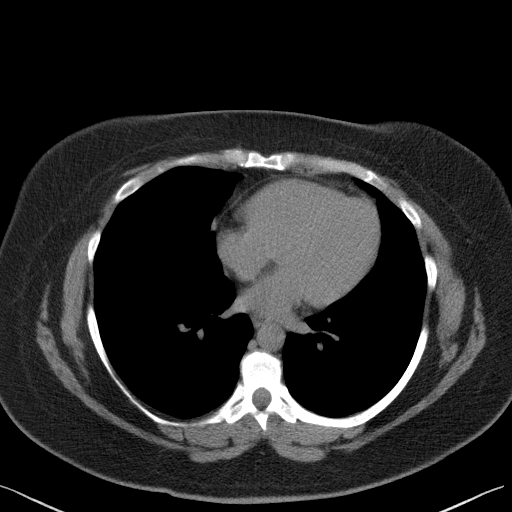

[Series 5: renal stone 3.0 coronal · coronal · 0.81mm/px · 3 of 80 slices shown]
[im 27/80  soft-tissue]
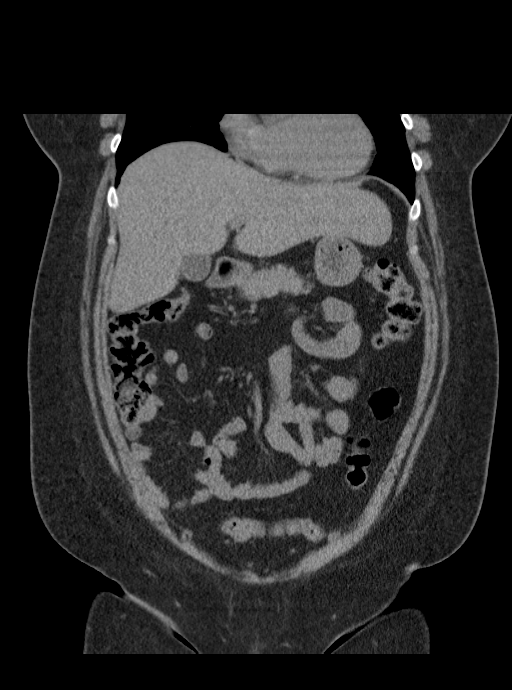
[im 36/80  soft-tissue]
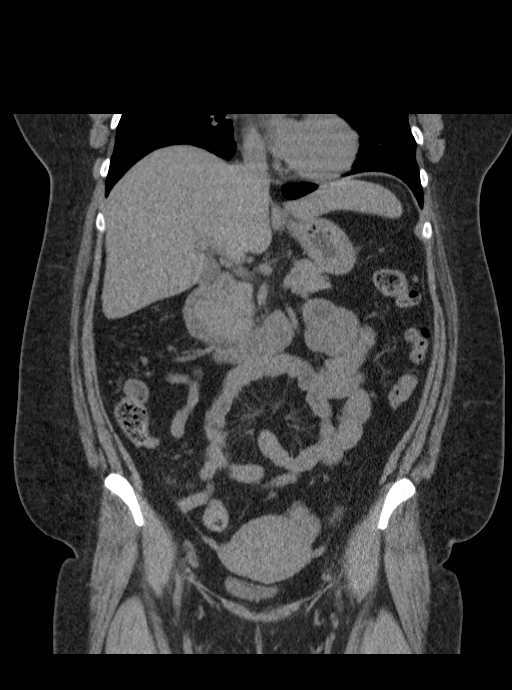
[im 44/80  soft-tissue]
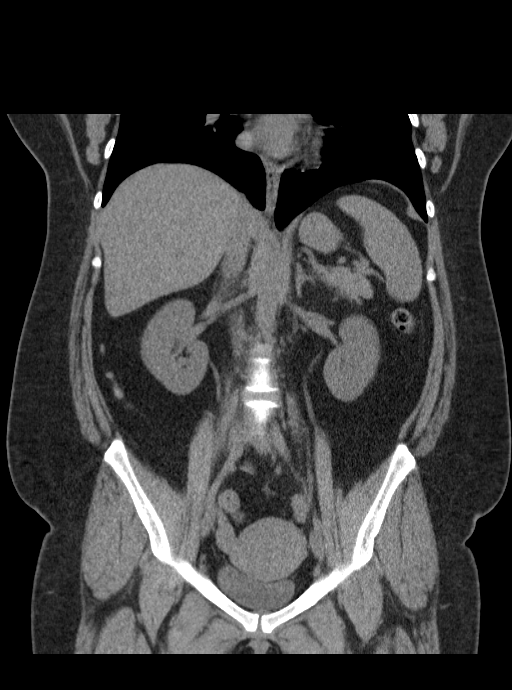

[16 of 46 positions shown; findings below may reference images not displayed]

FINDINGS: Linear subsegmental atelectasis in the lung bases bilaterally. No
pleural effusions. Heart is normal size.

2 cm gallstone within the fundus of the gallbladder. Liver, spleen,
pancreas, adrenals and kidneys have an unremarkable unenhanced
appearance. No renal or ureteral stone. No hydronephrosis.

Uterus, adnexae and urinary bladder are unremarkable. Appendix is
visualized and is normal. Bowel grossly unremarkable. No free fluid,
free air, or adenopathy. Stomach grossly unremarkable. Aorta is
normal caliber.

No acute bony abnormality.
IMPRESSION: Cholelithiasis.

No renal or ureteral stones.  No hydronephrosis.

## 2021-12-16 ENCOUNTER — Other Ambulatory Visit: Payer: Self-pay

## 2021-12-16 ENCOUNTER — Emergency Department (HOSPITAL_BASED_OUTPATIENT_CLINIC_OR_DEPARTMENT_OTHER)
Admission: EM | Admit: 2021-12-16 | Discharge: 2021-12-16 | Disposition: A | Discharge status: Home / Self Care | Payer: BLUE CROSS/BLUE SHIELD | Attending: Emergency Medicine | Admitting: Emergency Medicine

## 2021-12-16 ENCOUNTER — Encounter (HOSPITAL_BASED_OUTPATIENT_CLINIC_OR_DEPARTMENT_OTHER): Payer: Self-pay | Admitting: Emergency Medicine

## 2021-12-16 DIAGNOSIS — Z91040 Latex allergy status: Secondary | ICD-10-CM | POA: Diagnosis not present

## 2021-12-16 DIAGNOSIS — Z4801 Encounter for change or removal of surgical wound dressing: Secondary | ICD-10-CM | POA: Diagnosis present

## 2021-12-16 DIAGNOSIS — Z5189 Encounter for other specified aftercare: Secondary | ICD-10-CM

## 2021-12-16 NOTE — ED Provider Notes (Signed)
? ?[facility] EMERGENCY DEPARTMENT  ?Provider Note ? ?CSN: [id number] ?Arrival date & time: 12/16/21 0013 ? ?History ?Chief Complaint  ?Patient presents with  ? Wound Check  ? ? ?Traci Beck is a 46 y.o. female reports she had a breast biopsy on her R lateral breast earlier Monday morning. She went to sleep this evening and woke up with some blood coming from the dressing. She called the breast center and they told her to come to the ED.  ? ? ?Home Medications ?Prior to Admission medications   ?Medication Sig Start Date End Date Taking? Authorizing Provider  ?ergocalciferol (VITAMIN D2) 1.25 MG (50000 UT) capsule Take 50,000 Units by mouth once a week.   Yes [provider]  ?medroxyPROGESTERone (PROVERA) 10 MG tablet Take 10 mg by mouth every 2 (two) months. If period does not start   Yes [provider]  ?omeprazole (PRILOSEC) 20 MG capsule Take 20 mg by mouth daily.   Yes [provider]  ?propranolol (INDERAL) 10 MG tablet Take 10 mg by mouth daily.   Yes [provider]  ?aluminum & magnesium hydroxide-simethicone (MYLANTA) 500-450-40 MG/5ML suspension Take by mouth every 6 (six) hours as needed.    [provider]  ?chlorpheniramine-HYDROcodone (TUSSIONEX PENNKINETIC ER) 10-8 MG/5ML LQCR Take 5 mLs by mouth at bedtime as needed. 08/27/11   Margarita Grizzle, MD  ?cyclobenzaprine (FLEXERIL) 10 MG tablet Take 1 tablet (10 mg total) by mouth 2 (two) times daily as needed for muscle spasms. 11/11/13   Toy Cookey, MD  ?ibuprofen (ADVIL,MOTRIN) 600 MG tablet Take 1 tablet (600 mg total) by mouth every 6 (six) hours as needed. 11/11/13   Toy Cookey, MD  ?lansoprazole (PREVACID) 30 MG capsule Take 1 capsule (30 mg total) by mouth daily before breakfast. 30 minutes before meal 01/28/12 01/27/13  Rolan Bucco, MD  ?polyethylene glycol (MIRALAX) packet Take 17 g by mouth daily. 09/17/12   Elson Areas, PA-C  ?ranitidine (ZANTAC) 300 MG tablet Take 300 mg by  mouth 2 (two) times daily. Has not taken in 3 weeks    [provider]  ? ? ? ?Allergies    ?Latex ? ? ?Review of Systems   ?Review of Systems ?Please see HPI for pertinent positives and negatives ? ?Physical Exam ?BP 126/85 (BP Location: Left Arm)   Pulse 71   Temp 98 ?F (36.7 ?C) (Oral)   Resp 14   Ht 5' (1.524 m)   Wt 83 kg   SpO2 96%   BMI 35.74 kg/m?  ? ?Physical Exam ?Vitals and nursing note reviewed. Exam conducted with a chaperone present.  ?HENT:  ?   Head: Normocephalic.  ?   Nose: Nose normal.  ?Eyes:  ?   Extraocular Movements: Extraocular movements intact.  ?Pulmonary:  ?   Effort: Pulmonary effort is normal.  ?Chest:  ?   Comments: Biopsy wound on R breast with steristrips in place, no active bleeding or hematoma, small bruise just under the biopsy site with no tenderness  ?Musculoskeletal:     ?   General: Normal range of motion.  ?   Cervical back: Neck supple.  ?Skin: ?   Findings: No rash (on exposed skin).  ?Neurological:  ?   Mental Status: She is alert and oriented to person, place, and time.  ?Psychiatric:     ?   Mood and Affect: Mood normal.  ? ? ?ED Results / Procedures / Treatments   ?EKG ?None ? ?Procedures ?  Procedures ? ?Medications Ordered in the ED ?Medications - No data to display ? ?Initial Impression and Plan ? Patient with some bleeding after breast biopsy appears to have stopped. Wound redressed, recommend outpatient management as recommended by breast center.  ? ?ED Course  ? ?  ? ? ?MDM Rules/Calculators/A&P ?Medical Decision Making ?Problems Addressed: ?Visit for wound check: acute illness or injury ? ? ? ?Final Clinical Impression(s) / ED Diagnoses ?Final diagnoses:  ?Visit for wound check  ? ? ?Rx / DC Orders ?ED Discharge Orders   ? ? None  ? ?  ? ?  ?Pollyann Savoy, MD ?12/16/21 [phone] ? ?

## 2021-12-16 NOTE — ED Triage Notes (Signed)
Pt states she had a breast biopsy done Monday morning and states when she went to bed everything was fine but she woke up and is having bleeding from the site that she cannot get to quit  Pt called her dr and was told to come to the ED  ?

## 2024-05-01 ENCOUNTER — Other Ambulatory Visit: Payer: Self-pay

## 2024-05-01 ENCOUNTER — Emergency Department (HOSPITAL_BASED_OUTPATIENT_CLINIC_OR_DEPARTMENT_OTHER)

## 2024-05-01 ENCOUNTER — Emergency Department (HOSPITAL_BASED_OUTPATIENT_CLINIC_OR_DEPARTMENT_OTHER)
Admission: EM | Admit: 2024-05-01 | Discharge: 2024-05-01 | Disposition: A | Discharge status: Home / Self Care | Source: Ambulatory Visit

## 2024-05-01 ENCOUNTER — Encounter (HOSPITAL_BASED_OUTPATIENT_CLINIC_OR_DEPARTMENT_OTHER): Payer: Self-pay | Admitting: Emergency Medicine

## 2024-05-01 DIAGNOSIS — R42 Dizziness and giddiness: Secondary | ICD-10-CM | POA: Insufficient documentation

## 2024-05-01 DIAGNOSIS — M7918 Myalgia, other site: Secondary | ICD-10-CM | POA: Insufficient documentation

## 2024-05-01 DIAGNOSIS — Y92410 Unspecified street and highway as the place of occurrence of the external cause: Secondary | ICD-10-CM | POA: Diagnosis not present

## 2024-05-01 MED ORDER — ONDANSETRON HCL 4 MG PO TABS: 4.0000 mg | ORAL_TABLET | Freq: Four times a day (QID) | ORAL | 0 refills | Status: AC

## 2024-05-01 MED ORDER — METHOCARBAMOL 500 MG PO TABS: 500.0000 mg | ORAL_TABLET | Freq: Two times a day (BID) | ORAL | 0 refills | Status: AC | PRN
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
## 2024-05-01 NOTE — Discharge Instructions (Addendum)
 Take muscle relaxer and Zofran  as needed.  If symptoms persist follow-up with primary care.  If symptoms worsen return to the ED for further evaluation including shortness of breath, severe headache, multiple episodes of vomiting, or dizziness.
